# Patient Record
Sex: Male | Born: 1950 | Race: White | Hispanic: No | Marital: Married | Smoking: Never smoker
Health system: Southern US, Community
[De-identification: ages and names within clinical notes are randomized; demographics above are authoritative.]

## PROBLEM LIST (undated history)

## (undated) DIAGNOSIS — K4090 Unilateral inguinal hernia, without obstruction or gangrene, not specified as recurrent: Secondary | ICD-10-CM

## (undated) DIAGNOSIS — E7800 Pure hypercholesterolemia, unspecified: Secondary | ICD-10-CM

## (undated) DIAGNOSIS — C61 Malignant neoplasm of prostate: Secondary | ICD-10-CM

## (undated) HISTORY — PX: HERNIA REPAIR: SHX51

## (undated) HISTORY — PX: PROSTATE SURGERY: SHX751

---

## 2019-09-25 ENCOUNTER — Other Ambulatory Visit: Payer: Self-pay

## 2019-09-25 ENCOUNTER — Encounter (HOSPITAL_BASED_OUTPATIENT_CLINIC_OR_DEPARTMENT_OTHER): Payer: Self-pay | Admitting: Emergency Medicine

## 2019-09-25 ENCOUNTER — Emergency Department (HOSPITAL_BASED_OUTPATIENT_CLINIC_OR_DEPARTMENT_OTHER): Payer: Medicare Other

## 2019-09-25 ENCOUNTER — Emergency Department (HOSPITAL_BASED_OUTPATIENT_CLINIC_OR_DEPARTMENT_OTHER)
Admission: EM | Admit: 2019-09-25 | Discharge: 2019-09-25 | Disposition: A | Discharge status: Home / Self Care | Payer: Medicare Other | Attending: Emergency Medicine | Admitting: Emergency Medicine

## 2019-09-25 DIAGNOSIS — R0789 Other chest pain: Secondary | ICD-10-CM

## 2019-09-25 DIAGNOSIS — Z79899 Other long term (current) drug therapy: Secondary | ICD-10-CM | POA: Diagnosis not present

## 2019-09-25 DIAGNOSIS — Z881 Allergy status to other antibiotic agents status: Secondary | ICD-10-CM | POA: Diagnosis not present

## 2019-09-25 DIAGNOSIS — Z20828 Contact with and (suspected) exposure to other viral communicable diseases: Secondary | ICD-10-CM | POA: Insufficient documentation

## 2019-09-25 DIAGNOSIS — Z888 Allergy status to other drugs, medicaments and biological substances status: Secondary | ICD-10-CM | POA: Insufficient documentation

## 2019-09-25 DIAGNOSIS — E782 Mixed hyperlipidemia: Secondary | ICD-10-CM | POA: Insufficient documentation

## 2019-09-25 DIAGNOSIS — J189 Pneumonia, unspecified organism: Secondary | ICD-10-CM | POA: Insufficient documentation

## 2019-09-25 DIAGNOSIS — Z8546 Personal history of malignant neoplasm of prostate: Secondary | ICD-10-CM | POA: Insufficient documentation

## 2019-09-25 DIAGNOSIS — Z882 Allergy status to sulfonamides status: Secondary | ICD-10-CM | POA: Insufficient documentation

## 2019-09-25 HISTORY — DX: Pure hypercholesterolemia, unspecified: E78.00

## 2019-09-25 HISTORY — DX: Malignant neoplasm of prostate: C61

## 2019-09-25 HISTORY — DX: Unilateral inguinal hernia, without obstruction or gangrene, not specified as recurrent: K40.90

## 2019-09-25 LAB — CBC
HCT: 44.4 % (ref 39.0–52.0)
Hemoglobin: 15.1 g/dL (ref 13.0–17.0)
MCH: 31.7 pg (ref 26.0–34.0)
MCHC: 34 g/dL (ref 30.0–36.0)
MCV: 93.3 fL (ref 80.0–100.0)
Platelets: 146 10*3/uL — ABNORMAL LOW (ref 150–400)
RBC: 4.76 MIL/uL (ref 4.22–5.81)
RDW: 13.2 % (ref 11.5–15.5)
WBC: 6.5 10*3/uL (ref 4.0–10.5)
nRBC: 0 % (ref 0.0–0.2)

## 2019-09-25 LAB — COMPREHENSIVE METABOLIC PANEL
ALT: 34 U/L (ref 0–44)
AST: 35 U/L (ref 15–41)
Albumin: 4 g/dL (ref 3.5–5.0)
Alkaline Phosphatase: 55 U/L (ref 38–126)
Anion gap: 10 (ref 5–15)
BUN: 14 mg/dL (ref 8–23)
CO2: 21 mmol/L — ABNORMAL LOW (ref 22–32)
Calcium: 9.2 mg/dL (ref 8.9–10.3)
Chloride: 107 mmol/L (ref 98–111)
Creatinine, Ser: 0.84 mg/dL (ref 0.61–1.24)
GFR calc Af Amer: >60 mL/min (ref >60)
GFR calc non Af Amer: >60 mL/min (ref >60)
Glucose, Bld: 159 mg/dL — ABNORMAL HIGH (ref 70–99)
Potassium: 3.8 mmol/L (ref 3.5–5.1)
Sodium: 138 mmol/L (ref 135–145)
Total Bilirubin: 1 mg/dL (ref 0.3–1.2)
Total Protein: 7.2 g/dL (ref 6.5–8.1)

## 2019-09-25 LAB — SARS CORONAVIRUS 2 (TAT 6-24 HRS): SARS Coronavirus 2: NEGATIVE

## 2019-09-25 LAB — LIPASE, BLOOD: Lipase: 30 U/L (ref 11–51)

## 2019-09-25 LAB — TROPONIN I (HIGH SENSITIVITY): Troponin I (High Sensitivity): 2 ng/L (ref <18)

## 2019-09-25 MED ORDER — ACETAMINOPHEN 500 MG PO TABS
1000.0000 mg | ORAL_TABLET | Freq: Once | ORAL | Status: AC
  Administered 2019-09-25: 1000 mg via ORAL
  Filled 2019-09-25: qty 2

## 2019-09-25 MED ORDER — HYDROCODONE-ACETAMINOPHEN 5-325 MG PO TABS: 1.0000 | ORAL_TABLET | ORAL | 0 refills | Status: AC | PRN

## 2019-09-25 MED ORDER — SODIUM CHLORIDE 0.9 % IV SOLN
1.0000 g | Freq: Once | INTRAVENOUS | Status: AC
  Administered 2019-09-25: 1 g via INTRAVENOUS
  Filled 2019-09-25: qty 10

## 2019-09-25 MED ORDER — IOHEXOL 350 MG/ML SOLN
100.0000 mL | Freq: Once | INTRAVENOUS | Status: AC | PRN
  Administered 2019-09-25: 83 mL via INTRAVENOUS

## 2019-09-25 MED ORDER — AZITHROMYCIN 250 MG PO TABS: 250.0000 mg | ORAL_TABLET | Freq: Every day | ORAL | 0 refills | Status: AC

## 2019-09-25 MED ORDER — AZITHROMYCIN 250 MG PO TABS
500.0000 mg | ORAL_TABLET | Freq: Once | ORAL | Status: AC
  Administered 2019-09-25: 500 mg via ORAL
  Filled 2019-09-25: qty 2

## 2019-09-25 NOTE — ED Provider Notes (Signed)
[facility] EMERGENCY DEPARTMENT Provider Note   CSN: [id number] Arrival date & time: 09/25/19  0847     History   Chief Complaint Chief Complaint  Patient presents with   Chest Pain    HPI Aaron Henson is a 68 y.o. male.     Pt presents to the ED today with left sided chest wall pain.  Pt said he had some epigastric pain on Monday, 11/9.  That went away by the 10th, then he developed some left sided chest wall pain.  It hurts more with deep inspiration.  It did not go away, so he went to his pcp's office and was sent here for CTA.  The pt denies sob.  No leg pain or swelling.  He is active and is a cyclist.     Past Medical History:  Diagnosis Date   Hypercholesteremia    Prostate cancer ([place])    Right groin hernia     There are no active problems to display for this patient.   Past Surgical History:  Procedure Laterality Date   HERNIA REPAIR     PROSTATE SURGERY          Home Medications    Prior to Admission medications   Medication Sig Start Date End Date Taking? Authorizing Provider  azithromycin (ZITHROMAX) 250 MG tablet Take 1 tablet (250 mg total) by mouth daily. Take first 2 tablets together, then 1 every day until finished. 09/26/19   Isla Pence, MD  HYDROcodone-acetaminophen (NORCO/VICODIN) 5-325 MG tablet Take 1 tablet by mouth every 4 (four) hours as needed. 09/25/19   Isla Pence, MD  rosuvastatin (CRESTOR) 20 MG tablet Take 20 mg by mouth at bedtime. 08/06/19   [provider]    Family History History reviewed. No pertinent family history.  Social History Social History   Tobacco Use   Smoking status: Never Smoker   Smokeless tobacco: Never Used  Substance Use Topics   Alcohol use: Not on file   Drug use: Not on file     Allergies   Sulfamethoxazole, Atorvastatin, and Doxycycline   Review of Systems Review of Systems  Cardiovascular: Positive for chest pain.  All other systems reviewed and  are negative.    Physical Exam Updated Vital Signs BP 139/85    Pulse 78    Temp 98.4 F (36.9 C) (Oral)    Resp (!) 22    Ht 6\' 2"  (1.88 m)    Wt 81.6 kg    SpO2 97%    BMI 23.11 kg/m   Physical Exam Vitals signs and nursing note reviewed.  Constitutional:      Appearance: He is well-developed.  HENT:     Head: Normocephalic and atraumatic.  Eyes:     Extraocular Movements: Extraocular movements intact.     Pupils: Pupils are equal, round, and reactive to light.  Neck:     Musculoskeletal: Normal range of motion and neck supple.  Cardiovascular:     Rate and Rhythm: Normal rate and regular rhythm.     Heart sounds: Normal heart sounds.  Pulmonary:     Effort: Pulmonary effort is normal.     Breath sounds: Normal breath sounds.  Abdominal:     General: Bowel sounds are normal.     Palpations: Abdomen is soft.  Musculoskeletal: Normal range of motion.  Skin:    General: Skin is warm.     Capillary Refill: Capillary refill takes less than 2 seconds.  Neurological:  General: No focal deficit present.     Mental Status: He is alert and oriented to person, place, and time.  Psychiatric:        Mood and Affect: Mood normal.        Behavior: Behavior normal.      ED Treatments / Results  Labs (all labs ordered are listed, but only abnormal results are displayed) Labs Reviewed  CBC - Abnormal; Notable for the following components:      Result Value   Platelets 146 (*)    All other components within normal limits  COMPREHENSIVE METABOLIC PANEL - Abnormal; Notable for the following components:   CO2 21 (*)    Glucose, Bld 159 (*)    All other components within normal limits  SARS CORONAVIRUS 2 (TAT 6-24 HRS)  LIPASE, BLOOD  URINALYSIS, ROUTINE W REFLEX MICROSCOPIC  TROPONIN I (HIGH SENSITIVITY)  TROPONIN I (HIGH SENSITIVITY)    EKG EKG Interpretation  Date/Time:  Friday September 25 2019 08:57:48 EST Ventricular Rate:  77 PR Interval:    QRS  Duration: 95 QT Interval:  379 QTC Calculation: 429 R Axis:   1 Text Interpretation: Sinus rhythm Anteroseptal infarct, old No old tracing to compare Confirmed by Isla Pence [id number]) on 09/25/2019 9:19:10 AM   Radiology Ct Angio Chest Pe W/cm &/or Wo Cm  Result Date: 09/25/2019 CLINICAL DATA:  68 year old male with history of left anterior and lateral rib pain, particularly during inspiration. EXAM: CT ANGIOGRAPHY CHEST WITH CONTRAST TECHNIQUE: Multidetector CT imaging of the chest was performed using the standard protocol during bolus administration of intravenous contrast. Multiplanar CT image reconstructions and MIPs were obtained to evaluate the vascular anatomy. CONTRAST:  31mL OMNIPAQUE IOHEXOL 350 MG/ML SOLN COMPARISON:  No priors. FINDINGS: Cardiovascular: No filling defects within the pulmonary arterial tree to suggest underlying pulmonary embolism. Heart size is normal. There is no significant pericardial fluid, thickening or pericardial calcification. There is aortic atherosclerosis, as well as atherosclerosis of the great vessels of the mediastinum and the coronary arteries, including calcified atherosclerotic plaque in the left main, left anterior descending, left circumflex and right coronary arteries. Mediastinum/Nodes: No pathologically enlarged mediastinal or hilar lymph nodes. Please note that accurate exclusion of hilar adenopathy is limited on noncontrast CT scans. Esophagus is unremarkable in appearance. No axillary lymphadenopathy. Lungs/Pleura: Thickening of the peribronchovascular interstitium with patchy areas of regional architectural distortion, peribronchovascular ground-glass attenuation and some consolidative airspace disease in the lower lobes of the lungs bilaterally (left greater than right). No pleural effusions. No definite suspicious appearing pulmonary nodules or masses are noted. Upper Abdomen: Unremarkable. Musculoskeletal: There are no aggressive appearing lytic  or blastic lesions noted in the visualized portions of the skeleton. Review of the MIP images confirms the above findings. IMPRESSION: 1. No evidence of pulmonary embolism. 2. Bilateral lower lobe pneumonia. 3. Aortic atherosclerosis, in addition to left main and 3 vessel coronary artery disease. Please note that although the presence of coronary artery calcium documents the presence of coronary artery disease, the severity of this disease and any potential stenosis cannot be assessed on this non-gated CT examination. Assessment for potential risk factor modification, dietary therapy or pharmacologic therapy may be warranted, if clinically indicated. Aortic Atherosclerosis (ICD10-I70.0). Electronically Signed   By: Vinnie Langton M.D.   On: 09/25/2019 10:43    Procedures Procedures (including critical care time)  Medications Ordered in ED Medications  acetaminophen (TYLENOL) tablet 1,000 mg (has no administration in time range)  cefTRIAXone (ROCEPHIN) 1 g in  sodium chloride 0.9 % 100 mL IVPB (has no administration in time range)  azithromycin (ZITHROMAX) tablet 500 mg (has no administration in time range)  iohexol (OMNIPAQUE) 350 MG/ML injection 100 mL (83 mLs Intravenous Contrast Given 09/25/19 1012)     Initial Impression / Assessment and Plan / ED Course  I have reviewed the triage vital signs and the nursing notes.  Pertinent labs & imaging results that were available during my care of the patient were reviewed by me and considered in my medical decision making (see chart for details).     Pt does have bilateral lower lobe pna.  No PE.  EKG and trop ok, although pt does have some arthrosclerosis.  I don't think today's pain is cardiac in nature. Pt will be swabbed for covid.  He is told to self-isolate until his covid test comes back.  He is oxygenating 97-100% and looks nontoxic.  He will be treated with zithromax (allergic to doxy) and lortab for pain.  Return if worse.  F/u with  pcp.  Final Clinical Impressions(s) / ED Diagnoses   Final diagnoses:  Community acquired pneumonia, unspecified laterality  Chest wall pain    ED Discharge Orders         Ordered    azithromycin (ZITHROMAX) 250 MG tablet  Daily     09/25/19 1124    HYDROcodone-acetaminophen (NORCO/VICODIN) 5-325 MG tablet  Every 4 hours PRN     09/25/19 1124           Isla Pence, MD 09/25/19 1125

## 2019-09-25 NOTE — ED Triage Notes (Signed)
Pt states he felt a lump in his upper abdomen but it went away.  Pt then relates he felt a catch in his left rib area on Tuesday.  Its seems to be worsening.  Pt states he doesn't really feel sob.  No recent travel, no leg pain.

## 2019-09-25 NOTE — ED Notes (Signed)
Incentive spirometry education given by RT.  Pt tolerated well.

## 2020-11-04 IMAGING — CT CT ANGIO CHEST
2 of 8 series · 18 of 36 positions shown · IV contrast (Omnipaque)
Comparison: No priors.

CLINICAL DATA: 68-year-old male with history of left anterior and
lateral rib pain, particularly during inspiration.

EXAM:
CT ANGIOGRAPHY CHEST WITH CONTRAST
TECHNIQUE: Multidetector CT imaging of the chest was performed using the
standard protocol during bolus administration of intravenous
contrast. Multiplanar CT image reconstructions and MIPs were
obtained to evaluate the vascular anatomy.
CONTRAST:  83mL OMNIPAQUE IOHEXOL 350 MG/ML SOLN

[Series 6: pe thins · axial · 0.98mm/px · z∈[-307,-28]mm · 17 of 313 slices shown]
[im 17/313  lung]
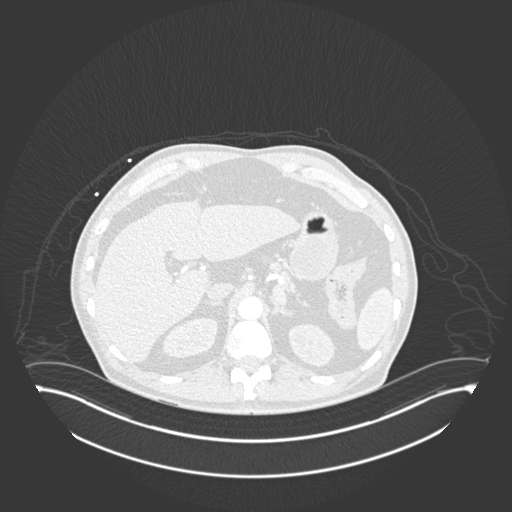
[im 33/313  mediastinal]
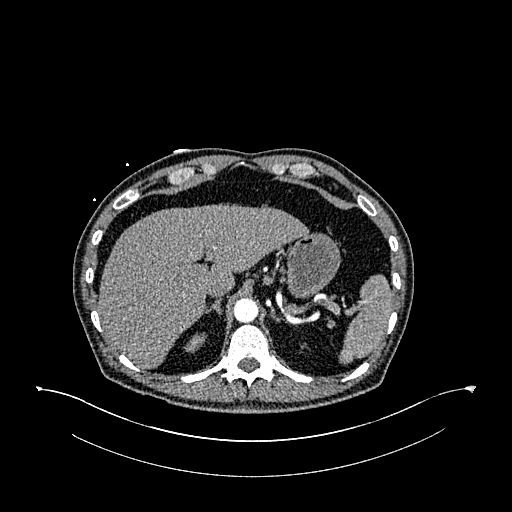
[im 50/313  lung]
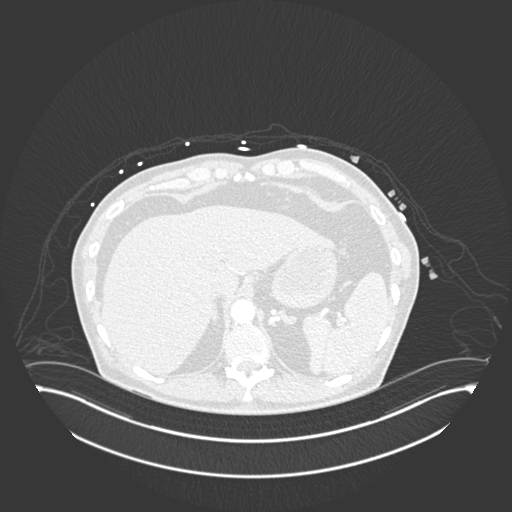
[im 66/313  mediastinal]
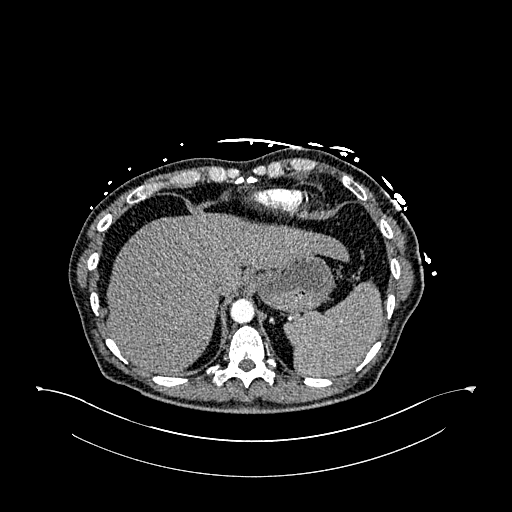
[im 83/313  lung]
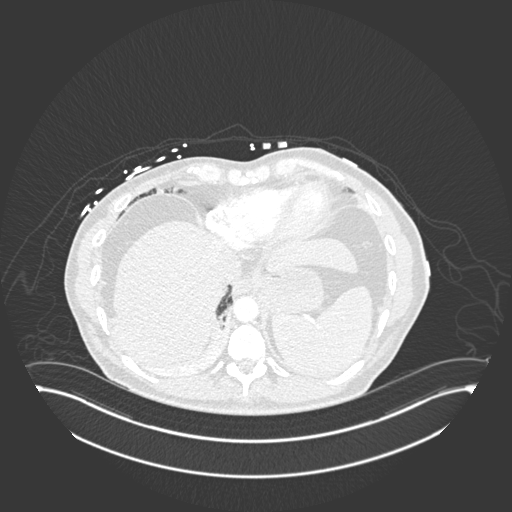
[im 99/313  mediastinal]
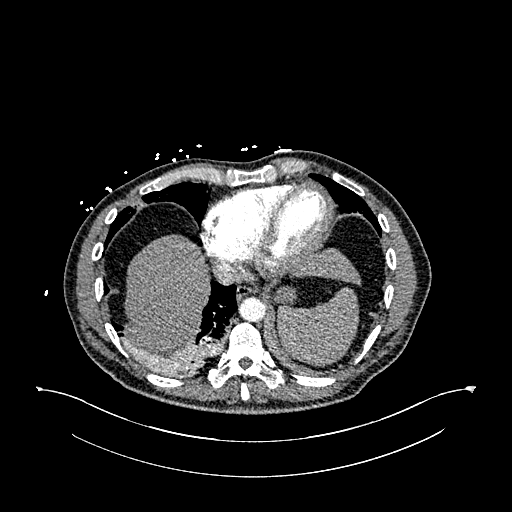
[im 115/313  lung]
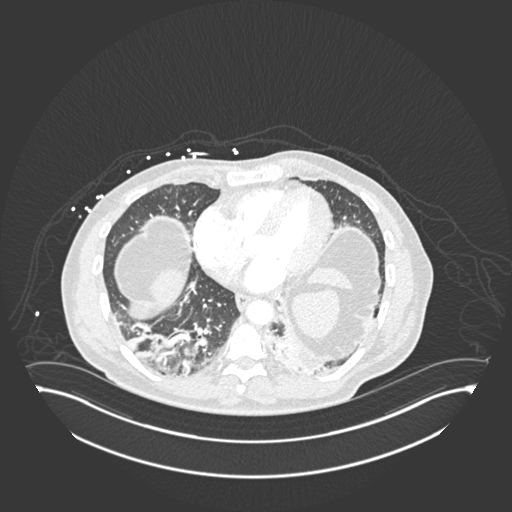
[im 132/313  mediastinal]
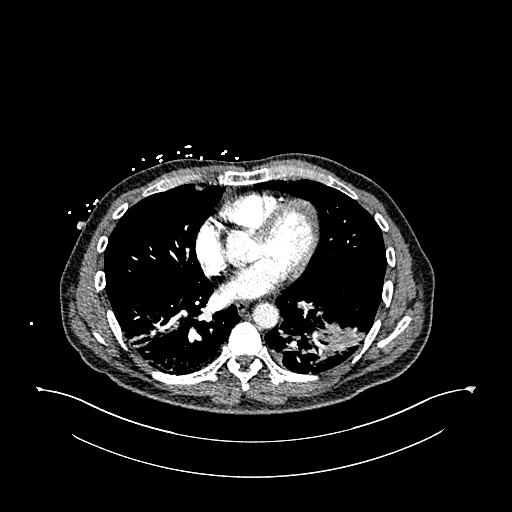
[im 165/313  lung]
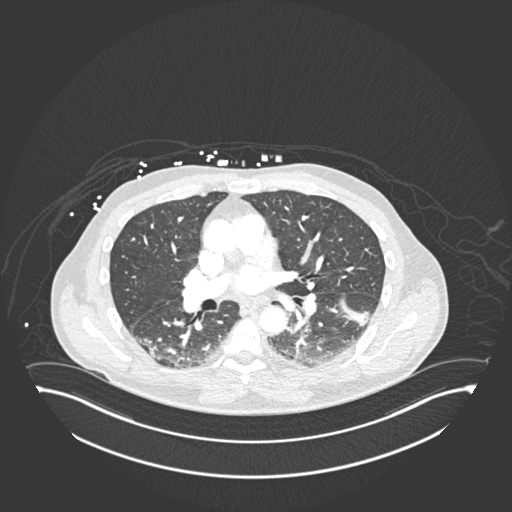
[im 181/313  mediastinal]
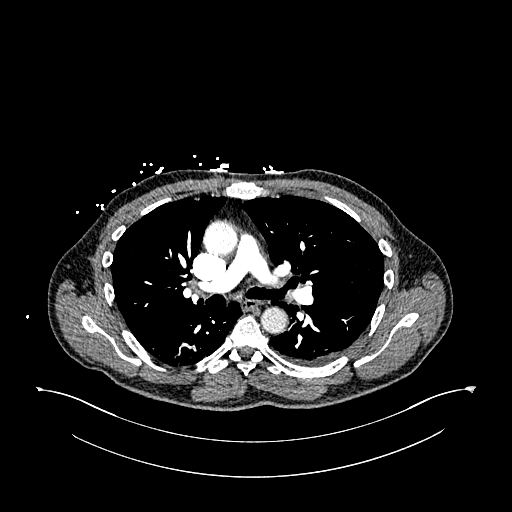
[im 198/313  lung]
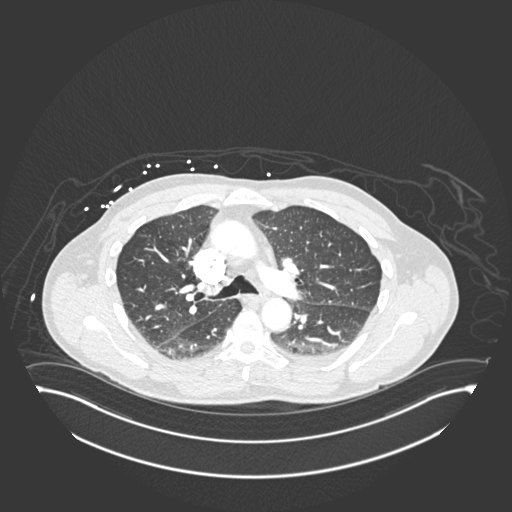
[im 214/313  mediastinal]
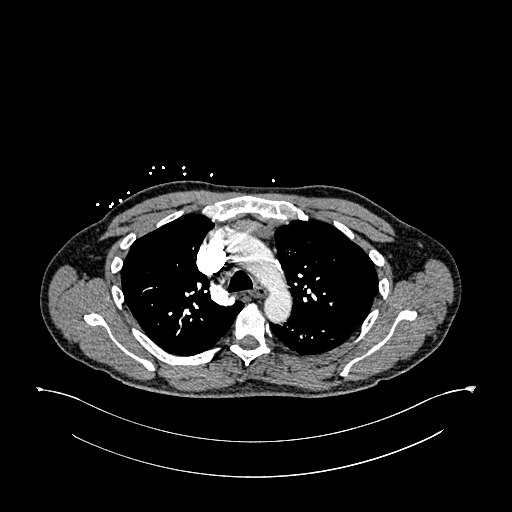
[im 230/313  lung]
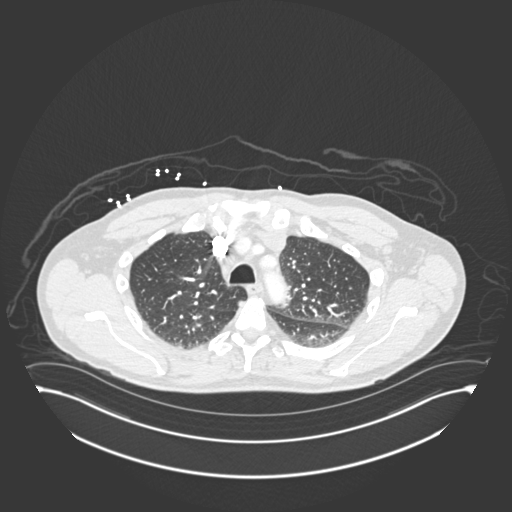
[im 247/313  mediastinal]
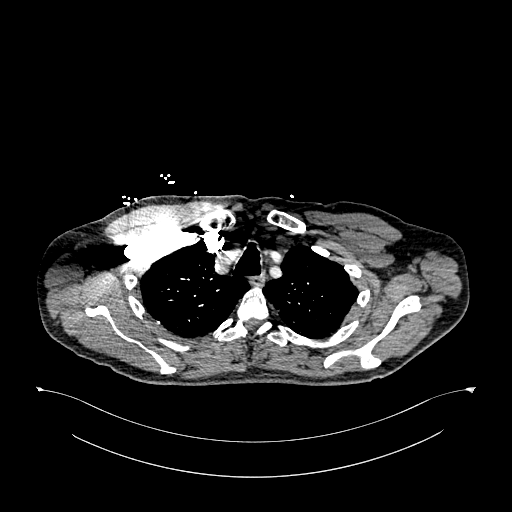
[im 263/313  lung]
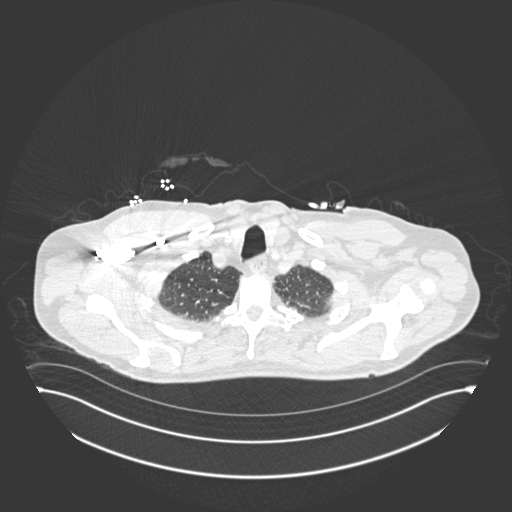
[im 280/313  mediastinal]
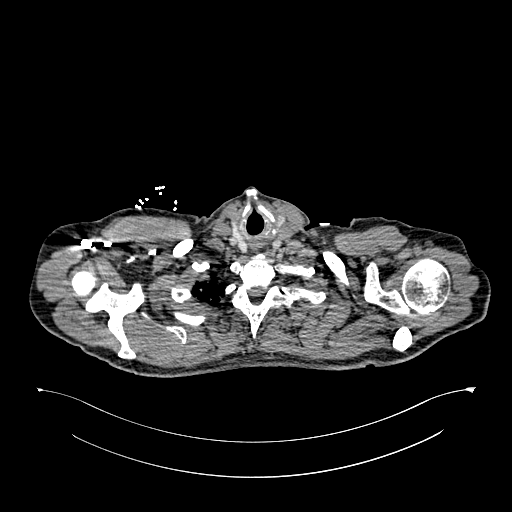
[im 296/313  lung]
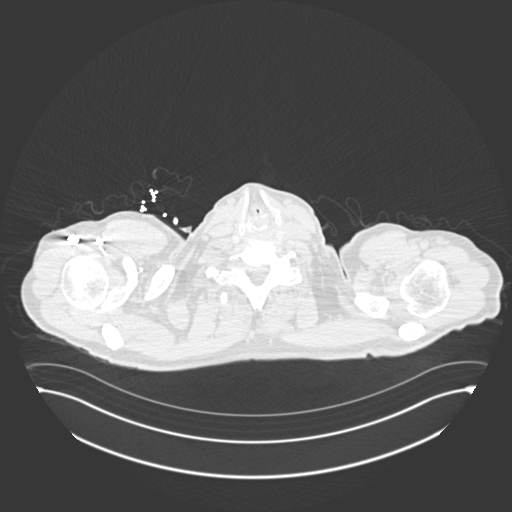

[Series 7: pe coronal mpr · coronal · 0.61mm/px · 1 of 151 slices shown]
[im 76/151  mediastinal]
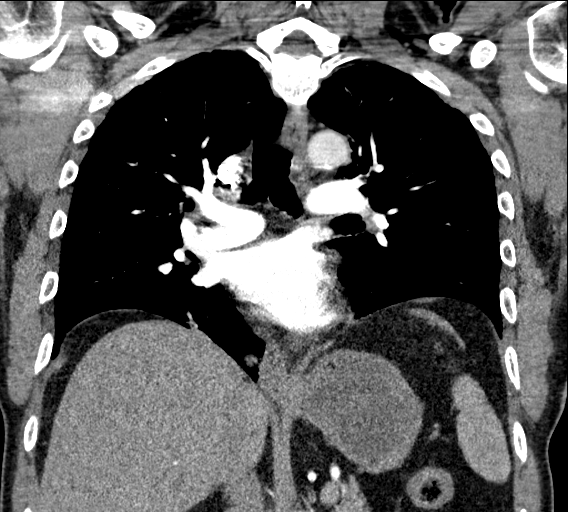

[18 of 36 positions shown; findings below may reference images not displayed]

FINDINGS: Cardiovascular: No filling defects within the pulmonary arterial
tree to suggest underlying pulmonary embolism. Heart size is normal.
There is no significant pericardial fluid, thickening or pericardial
calcification. There is aortic atherosclerosis, as well as
atherosclerosis of the great vessels of the mediastinum and the
coronary arteries, including calcified atherosclerotic plaque in the
left main, left anterior descending, left circumflex and right
coronary arteries.

Mediastinum/Nodes: No pathologically enlarged mediastinal or hilar
lymph nodes. Please note that accurate exclusion of hilar adenopathy
is limited on noncontrast CT scans. Esophagus is unremarkable in
appearance. No axillary lymphadenopathy.

Lungs/Pleura: Thickening of the peribronchovascular interstitium
with patchy areas of regional architectural distortion,
peribronchovascular ground-glass attenuation and some consolidative
airspace disease in the lower lobes of the lungs bilaterally (left
greater than right). No pleural effusions. No definite suspicious
appearing pulmonary nodules or masses are noted.

Upper Abdomen: Unremarkable.

Musculoskeletal: There are no aggressive appearing lytic or blastic
lesions noted in the visualized portions of the skeleton.

Review of the MIP images confirms the above findings.
IMPRESSION: 1. No evidence of pulmonary embolism.
2. Bilateral lower lobe pneumonia.
3. Aortic atherosclerosis, in addition to left main and 3 vessel
coronary artery disease. Please note that although the presence of
coronary artery calcium documents the presence of coronary artery
disease, the severity of this disease and any potential stenosis
cannot be assessed on this non-gated CT examination. Assessment for
potential risk factor modification, dietary therapy or pharmacologic
therapy may be warranted, if clinically indicated.

Aortic Atherosclerosis ([id number]).

## 2023-02-21 ENCOUNTER — Other Ambulatory Visit: Payer: Self-pay | Admitting: Orthopaedic Surgery

## 2023-02-21 DIAGNOSIS — M5416 Radiculopathy, lumbar region: Secondary | ICD-10-CM

## 2023-03-03 ENCOUNTER — Ambulatory Visit
Admission: RE | Admit: 2023-03-03 | Discharge: 2023-03-03 | Disposition: A | Discharge status: Home / Self Care | Payer: Medicare Other | Source: Ambulatory Visit | Attending: Orthopaedic Surgery | Admitting: Orthopaedic Surgery

## 2023-03-03 DIAGNOSIS — M5416 Radiculopathy, lumbar region: Secondary | ICD-10-CM
